# Patient Record
Sex: Male | Born: 1980
Health system: Southern US, Community
[De-identification: ages and names within clinical notes are randomized; demographics above are authoritative.]

## PROBLEM LIST (undated history)

## (undated) HISTORY — PX: SHOULDER SURGERY: SHX246

---

## 2011-01-25 ENCOUNTER — Emergency Department (HOSPITAL_BASED_OUTPATIENT_CLINIC_OR_DEPARTMENT_OTHER)
Admission: EM | Admit: 2011-01-25 | Discharge: 2011-01-25 | Disposition: A | Discharge status: Home / Self Care | Payer: BC Managed Care – PPO | Attending: Emergency Medicine | Admitting: Emergency Medicine

## 2011-01-25 DIAGNOSIS — H5789 Other specified disorders of eye and adnexa: Secondary | ICD-10-CM | POA: Insufficient documentation

## 2011-01-25 DIAGNOSIS — H109 Unspecified conjunctivitis: Secondary | ICD-10-CM | POA: Insufficient documentation

## 2011-05-06 ENCOUNTER — Emergency Department (HOSPITAL_BASED_OUTPATIENT_CLINIC_OR_DEPARTMENT_OTHER)
Admission: EM | Admit: 2011-05-06 | Discharge: 2011-05-06 | Disposition: A | Discharge status: Home / Self Care | Payer: BC Managed Care – PPO | Attending: Emergency Medicine | Admitting: Emergency Medicine

## 2011-05-06 ENCOUNTER — Emergency Department (INDEPENDENT_AMBULATORY_CARE_PROVIDER_SITE_OTHER): Payer: BC Managed Care – PPO

## 2011-05-06 ENCOUNTER — Encounter: Payer: Self-pay | Admitting: *Deleted

## 2011-05-06 DIAGNOSIS — W219XXA Striking against or struck by unspecified sports equipment, initial encounter: Secondary | ICD-10-CM

## 2011-05-06 DIAGNOSIS — S43006A Unspecified dislocation of unspecified shoulder joint, initial encounter: Secondary | ICD-10-CM | POA: Insufficient documentation

## 2011-05-06 DIAGNOSIS — Y9367 Activity, basketball: Secondary | ICD-10-CM

## 2011-05-06 DIAGNOSIS — S43016A Anterior dislocation of unspecified humerus, initial encounter: Secondary | ICD-10-CM

## 2011-05-06 MED ORDER — OXYCODONE-ACETAMINOPHEN 5-325 MG PO TABS
2.0000 | ORAL_TABLET | Freq: Once | ORAL | Status: AC
  Administered 2011-05-06: 2 via ORAL
  Filled 2011-05-06: qty 2

## 2011-05-06 MED ORDER — OXYCODONE-ACETAMINOPHEN 5-325 MG PO TABS: 2.0000 | ORAL_TABLET | ORAL | Status: AC | PRN

## 2011-05-06 MED ORDER — ETOMIDATE 2 MG/ML IV SOLN
15.0000 mg | Freq: Once | INTRAVENOUS | Status: AC
  Administered 2011-05-06: 15 mg via INTRAVENOUS
  Filled 2011-05-06: qty 10

## 2011-05-06 NOTE — ED Notes (Signed)
Pt arrived via [facility] for injury to left shoulder while playing basketball.  IV started enroute pt given Fentanyl .
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
## 2011-05-06 NOTE — ED Notes (Signed)
Pt was set up for Conscious Sedation procedure of left shoulder and [place] 2 lpm was placed for procedure. Pt tolerated well.
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
## 2011-05-06 NOTE — ED Notes (Signed)
Pt awake and talking. Awaiting repeat xray.

## 2011-05-28 ENCOUNTER — Other Ambulatory Visit: Payer: Self-pay | Admitting: Orthopedic Surgery

## 2011-05-28 DIAGNOSIS — M25512 Pain in left shoulder: Secondary | ICD-10-CM

## 2011-06-03 ENCOUNTER — Ambulatory Visit
Admission: RE | Admit: 2011-06-03 | Discharge: 2011-06-03 | Disposition: A | Discharge status: Home / Self Care | Payer: BC Managed Care – PPO | Source: Ambulatory Visit | Attending: Orthopedic Surgery | Admitting: Orthopedic Surgery

## 2011-06-03 DIAGNOSIS — M25512 Pain in left shoulder: Secondary | ICD-10-CM

## 2011-06-03 MED ORDER — IOHEXOL 180 MG/ML  SOLN
15.0000 mL | Freq: Once | INTRAMUSCULAR | Status: AC | PRN
  Administered 2011-06-03: 15 mL via INTRA_ARTICULAR

## 2011-06-25 ENCOUNTER — Ambulatory Visit: Payer: BC Managed Care – PPO | Admitting: Physical Therapy

## 2011-06-27 ENCOUNTER — Ambulatory Visit: Payer: BC Managed Care – PPO | Attending: Orthopedic Surgery | Admitting: Physical Therapy

## 2011-06-27 DIAGNOSIS — IMO0001 Reserved for inherently not codable concepts without codable children: Secondary | ICD-10-CM | POA: Insufficient documentation

## 2011-06-27 DIAGNOSIS — M25519 Pain in unspecified shoulder: Secondary | ICD-10-CM | POA: Insufficient documentation

## 2011-06-27 DIAGNOSIS — M25619 Stiffness of unspecified shoulder, not elsewhere classified: Secondary | ICD-10-CM | POA: Insufficient documentation

## 2011-07-01 ENCOUNTER — Ambulatory Visit: Payer: BC Managed Care – PPO | Admitting: Physical Therapy

## 2011-07-02 ENCOUNTER — Encounter: Payer: BC Managed Care – PPO | Admitting: Physical Therapy

## 2011-07-03 ENCOUNTER — Ambulatory Visit: Payer: BC Managed Care – PPO | Admitting: Physical Therapy

## 2011-07-08 ENCOUNTER — Ambulatory Visit: Payer: BC Managed Care – PPO | Admitting: Physical Therapy

## 2011-07-10 ENCOUNTER — Ambulatory Visit: Payer: BC Managed Care – PPO | Admitting: Physical Therapy

## 2011-07-12 ENCOUNTER — Ambulatory Visit: Payer: BC Managed Care – PPO | Admitting: Physical Therapy

## 2011-07-16 ENCOUNTER — Ambulatory Visit: Payer: BC Managed Care – PPO | Attending: Orthopedic Surgery | Admitting: Physical Therapy

## 2011-07-16 DIAGNOSIS — M25519 Pain in unspecified shoulder: Secondary | ICD-10-CM | POA: Insufficient documentation

## 2011-07-16 DIAGNOSIS — M25619 Stiffness of unspecified shoulder, not elsewhere classified: Secondary | ICD-10-CM | POA: Insufficient documentation

## 2011-07-16 DIAGNOSIS — IMO0001 Reserved for inherently not codable concepts without codable children: Secondary | ICD-10-CM | POA: Insufficient documentation

## 2011-07-17 ENCOUNTER — Ambulatory Visit: Payer: BC Managed Care – PPO | Admitting: Physical Therapy

## 2011-07-19 ENCOUNTER — Ambulatory Visit: Payer: BC Managed Care – PPO | Admitting: Physical Therapy

## 2011-07-22 ENCOUNTER — Ambulatory Visit: Payer: BC Managed Care – PPO | Admitting: Physical Therapy

## 2011-07-24 ENCOUNTER — Ambulatory Visit: Payer: BC Managed Care – PPO | Admitting: Physical Therapy

## 2011-07-26 ENCOUNTER — Ambulatory Visit: Payer: BC Managed Care – PPO | Admitting: Physical Therapy

## 2011-07-29 ENCOUNTER — Ambulatory Visit: Payer: BC Managed Care – PPO | Admitting: Physical Therapy

## 2011-07-31 ENCOUNTER — Ambulatory Visit: Payer: BC Managed Care – PPO | Admitting: Physical Therapy

## 2011-08-01 ENCOUNTER — Ambulatory Visit: Payer: BC Managed Care – PPO | Admitting: Physical Therapy

## 2011-08-02 ENCOUNTER — Encounter: Payer: BC Managed Care – PPO | Admitting: Physical Therapy

## 2011-08-07 ENCOUNTER — Ambulatory Visit: Payer: BC Managed Care – PPO | Admitting: Physical Therapy

## 2011-08-08 ENCOUNTER — Ambulatory Visit: Payer: BC Managed Care – PPO | Admitting: Physical Therapy

## 2011-08-09 ENCOUNTER — Ambulatory Visit: Payer: BC Managed Care – PPO | Admitting: Physical Therapy

## 2011-08-12 ENCOUNTER — Ambulatory Visit: Payer: BC Managed Care – PPO | Admitting: Physical Therapy

## 2012-10-14 IMAGING — CR DG SHOULDER 1V*L*
2 series · 2 of 2 positions shown · non-contrast
Comparison: Prereduction radiographs performed earlier today at
[DATE] p.m.

CLINICAL DATA: Status post reduction of left shoulder dislocation.

PORTABLE LEFT SHOULDER - 2+ VIEW

[view not recorded (1 of 2)]
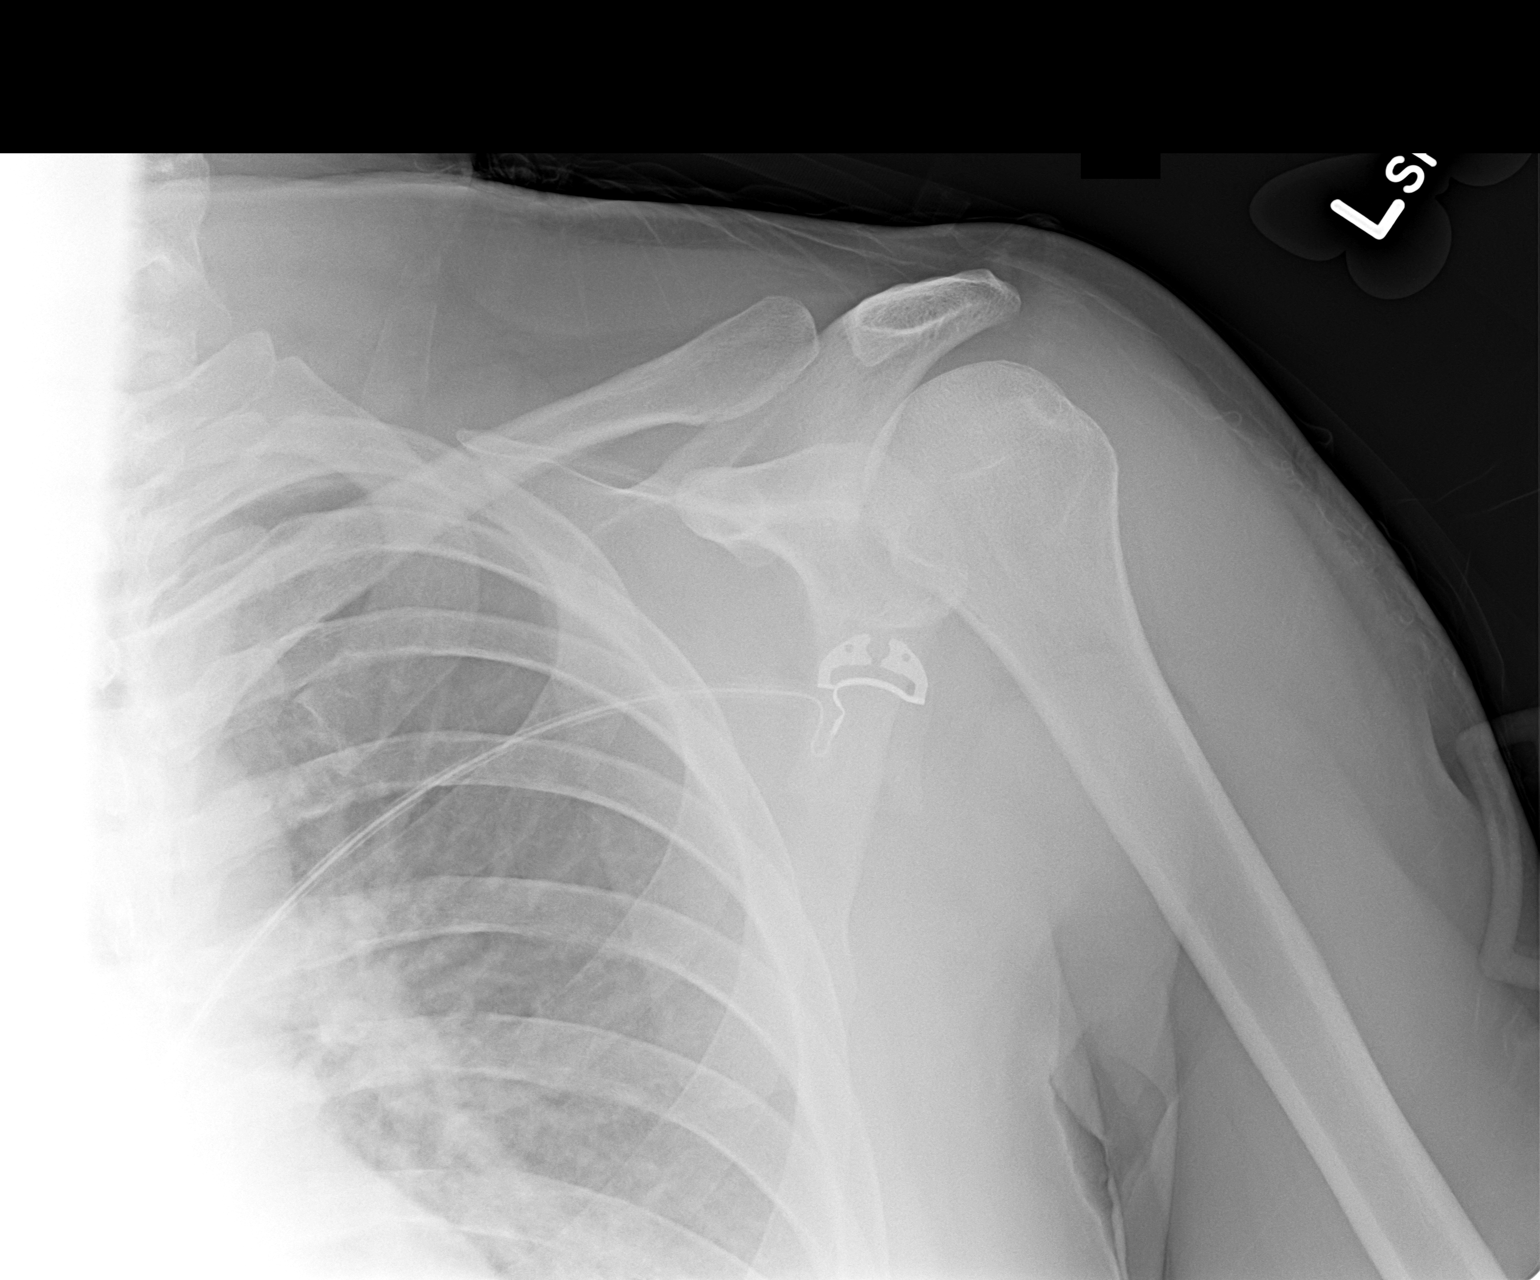

[view not recorded (2 of 2)]
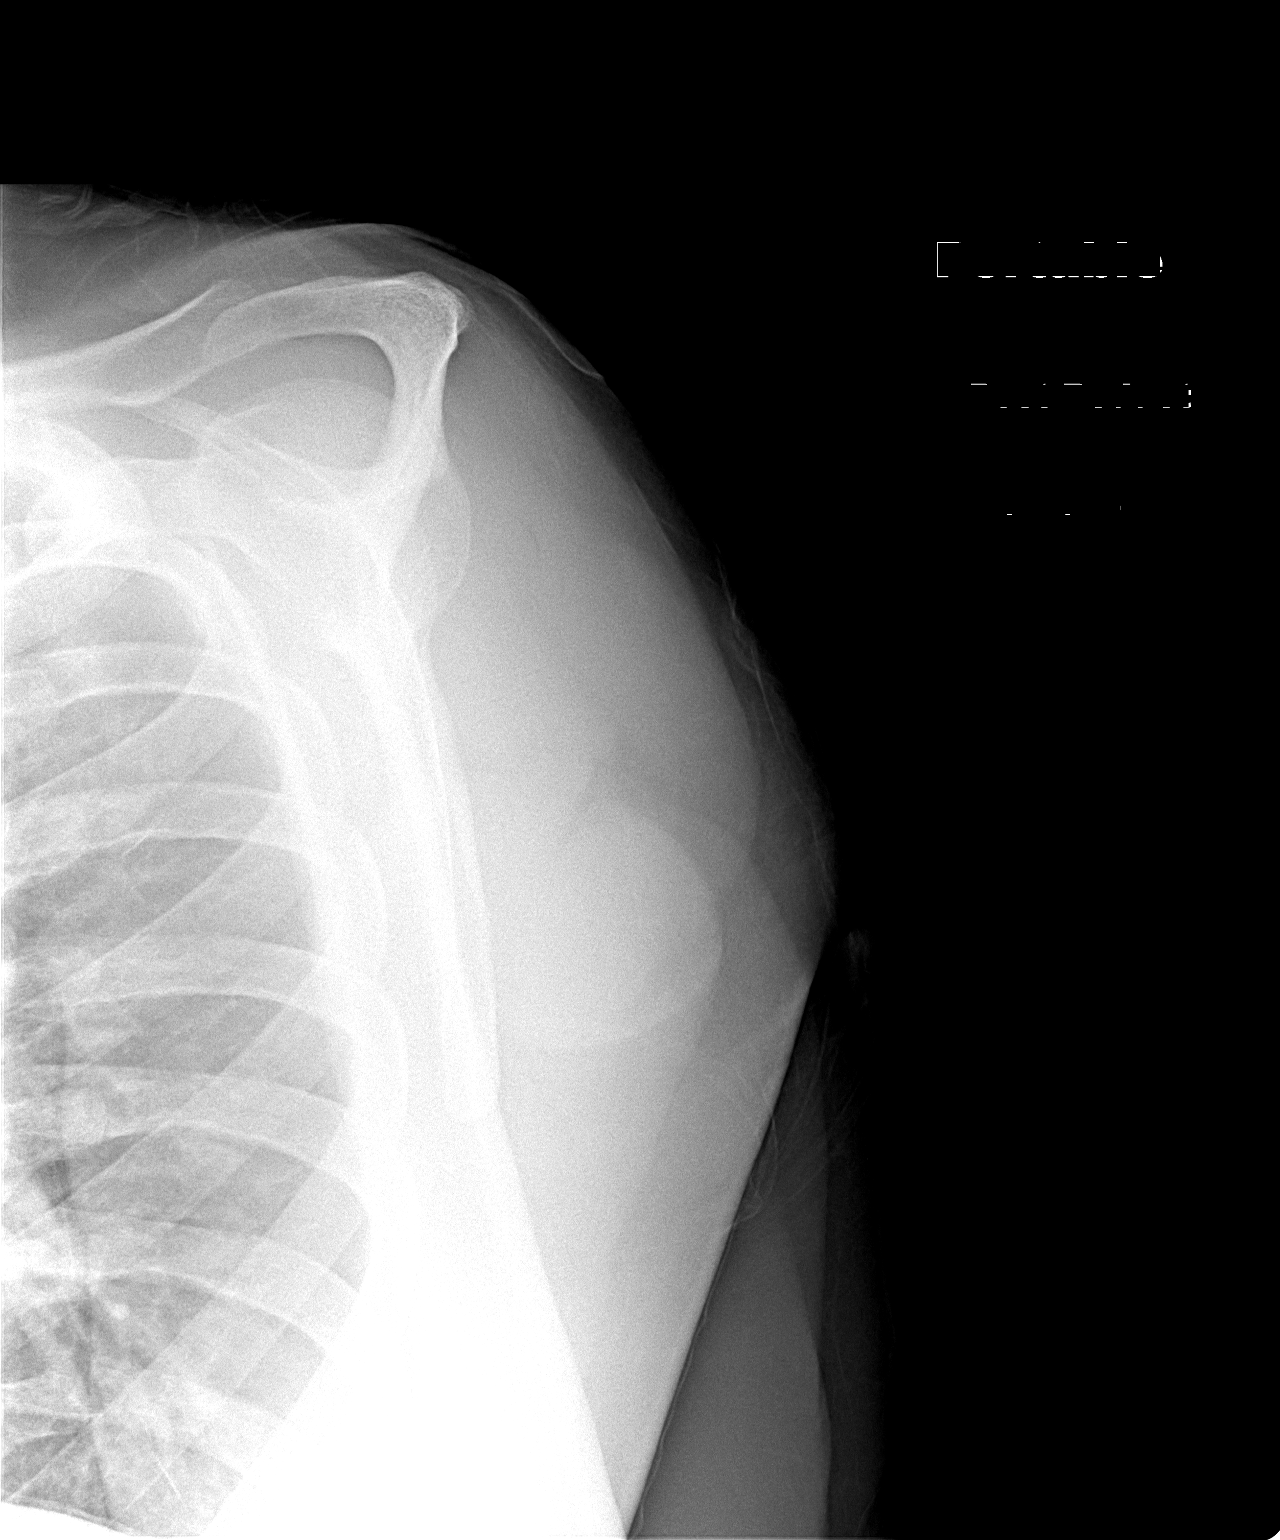

[2 of 2 positions shown; findings below may reference images not displayed]

FINDINGS: There has been successful interval reduction of the
previously noted dislocation of the left humeral head.  The left
humeral head is seated normally at the glenoid fossa.  No definite
Hill-Sachs lesion is characterized on this study.  No osseous
Bankart lesion is seen.

No significant soft tissue abnormalities are characterized.  The
visualized portions of the left lung appear clear.  The left
acromioclavicular joint is unremarkable in appearance.
IMPRESSION: Successful reduction of left humeral head dislocation.  No definite
Hill-Sachs or osseous Bankart lesion seen.

## 2012-11-11 IMAGING — RF DG FLUORO GUIDE NDL PLC/BX
2 series · 2 of 2 positions shown · IV contrast (multihance)
Comparison: none

CLINICAL DATA: Left shoulder pain.  Recent dislocation.  Previous
shoulder surgery.

Fluoroscopy Time: 38 seconds
LEFT SHOULDER INJECTION UNDER FLUOROSCOPY
TECHNIQUE: An appropriate skin entrance site was determined. The
site was marked, prepped with Betadine, draped in the usual sterile
fashion, and infiltrated locally with buffered Lidocaine.  22 gauge
spinal needle was advanced to the superomedial margin of the
humeral head under intermittent fluoroscopy.  1 ml of Lidocaine
injected easily.  A mixture of 0.1 ml Multihance and 20 ml of
dilute Omnipaque 180 was then used to opacify the left shoulder
capsule.  No immediate complication.

[Series 1: (hospital) · 1 of 1 slices shown (1 of 2)]
[im 1/1]
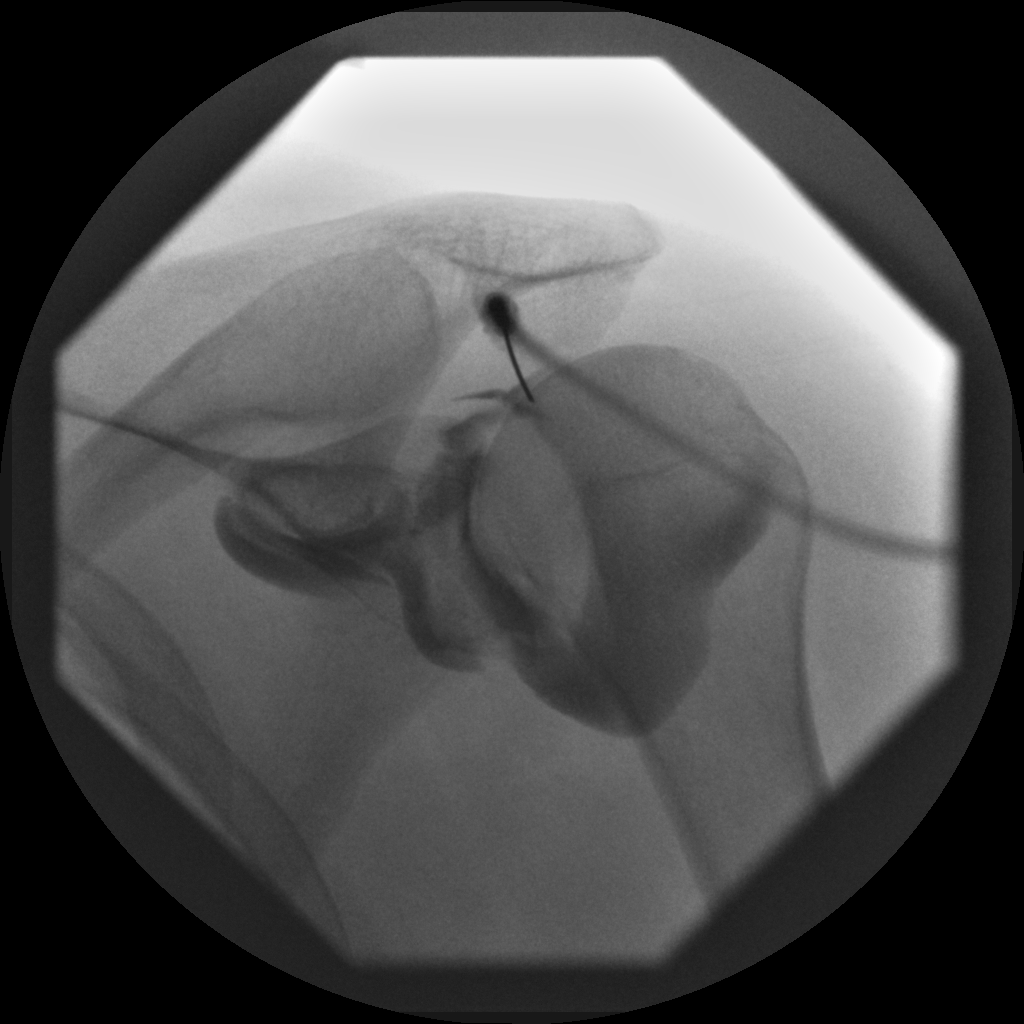

[Series 2: (hospital) · 1 of 1 slices shown (2 of 2)]
[im 1/1]
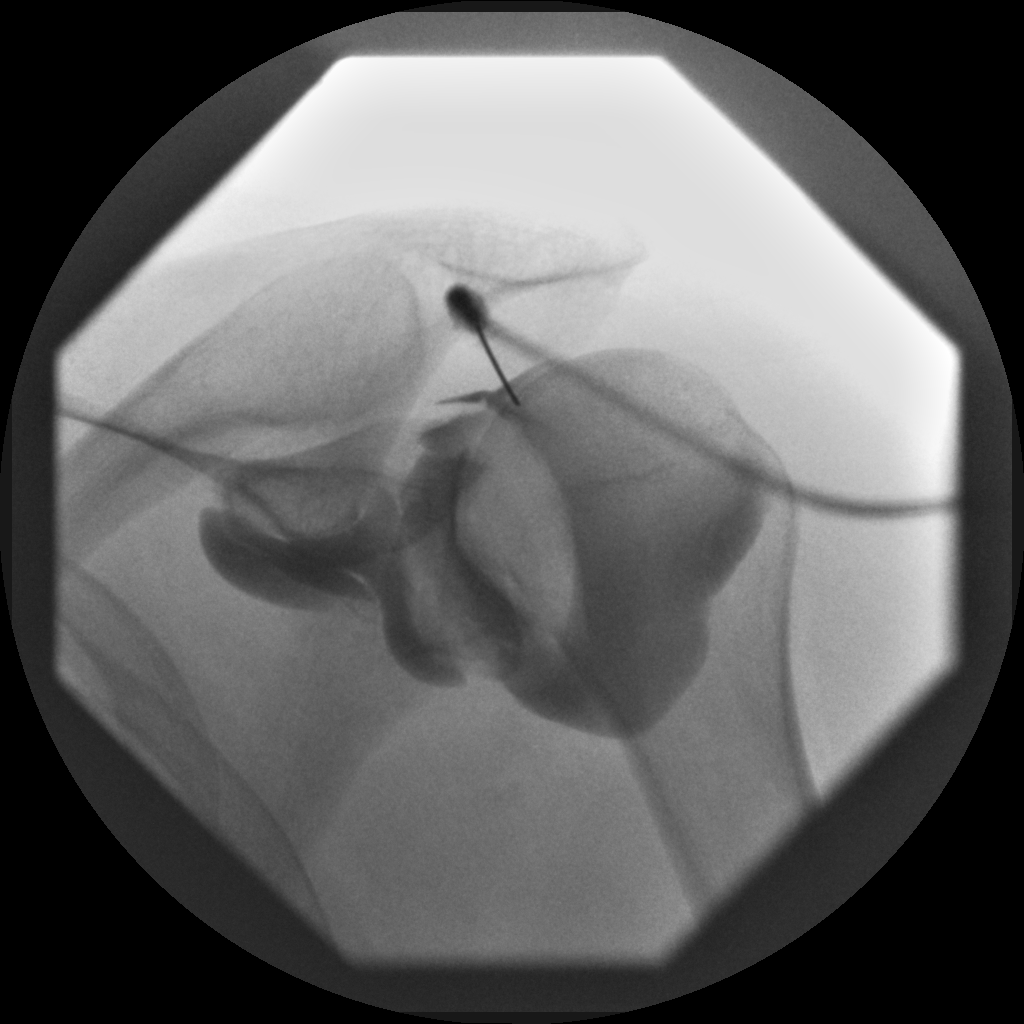

[2 of 2 positions shown; findings below may reference images not displayed]

IMPRESSION: Technically successful left shoulder injection for MRI.

## 2013-08-02 ENCOUNTER — Ambulatory Visit (INDEPENDENT_AMBULATORY_CARE_PROVIDER_SITE_OTHER): Payer: BC Managed Care – PPO | Admitting: Neurology

## 2013-08-02 ENCOUNTER — Encounter: Payer: Self-pay | Admitting: Neurology

## 2013-08-02 VITALS — BP 130/80 | HR 80 | Temp 97.9°F | Resp 14 | Ht 68.0 in | Wt 203.8 lb

## 2013-08-02 DIAGNOSIS — F0781 Postconcussional syndrome: Secondary | ICD-10-CM | POA: Insufficient documentation

## 2013-08-02 MED ORDER — AMITRIPTYLINE HCL 25 MG PO TABS: 25.0000 mg | ORAL_TABLET | Freq: Every day | ORAL | Status: DC
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
## 2013-08-02 NOTE — Patient Instructions (Signed)
You have post-concussion syndrome:  1.  Start amitriptyline 25mg  daily for headache prevention 2.  You may resume your usual activity with frequent breaks, especially if you notice cognitive fatigue.   3.  Literature has been provided on post-concussion syndrome 4.  Return to clinic in 6-weeks

## 2013-09-14 ENCOUNTER — Encounter: Payer: Self-pay | Admitting: Neurology

## 2013-09-14 ENCOUNTER — Ambulatory Visit (INDEPENDENT_AMBULATORY_CARE_PROVIDER_SITE_OTHER): Payer: BC Managed Care – PPO | Admitting: Neurology

## 2013-09-14 VITALS — BP 120/84 | HR 80 | Temp 98.1°F | Wt 203.5 lb

## 2013-09-14 DIAGNOSIS — F0781 Postconcussional syndrome: Secondary | ICD-10-CM

## 2013-09-14 MED ORDER — AMITRIPTYLINE HCL 50 MG PO TABS: 50.0000 mg | ORAL_TABLET | Freq: Every day | ORAL | Status: DC
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
## 2013-09-14 NOTE — Patient Instructions (Addendum)
1.  Increase amitriptyline to 50mg  daily.   2.  Discussed to limit use of ibuprofen, tylenol, or naproxsyn no more than twice per week 3.  Continue physical therapy  4.  Return to clinic in 336-months

## 2014-03-15 ENCOUNTER — Ambulatory Visit (INDEPENDENT_AMBULATORY_CARE_PROVIDER_SITE_OTHER): Payer: BC Managed Care – PPO | Admitting: Neurology

## 2014-03-15 ENCOUNTER — Encounter: Payer: Self-pay | Admitting: Neurology

## 2014-03-15 VITALS — BP 118/80 | HR 81 | Ht 69.29 in | Wt 204.5 lb

## 2014-03-15 DIAGNOSIS — G3184 Mild cognitive impairment, so stated: Secondary | ICD-10-CM

## 2014-03-15 DIAGNOSIS — F0781 Postconcussional syndrome: Secondary | ICD-10-CM
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
## 2014-03-15 NOTE — Patient Instructions (Addendum)
1.  Neuropsychiatry testing 2.  Continue amitriptyline 50mg  at bedtime 3.  Return to clinic in 636-months

## 2014-05-12 ENCOUNTER — Telehealth: Payer: Self-pay | Admitting: Neurology

## 2014-05-12 NOTE — Telephone Encounter (Signed)
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

## 2014-09-20 ENCOUNTER — Ambulatory Visit (INDEPENDENT_AMBULATORY_CARE_PROVIDER_SITE_OTHER): Payer: BLUE CROSS/BLUE SHIELD | Admitting: Neurology

## 2014-09-20 ENCOUNTER — Encounter: Payer: Self-pay | Admitting: Neurology

## 2014-09-20 VITALS — BP 124/86 | HR 74 | Ht 69.0 in | Wt 202.1 lb

## 2014-09-20 DIAGNOSIS — F0781 Postconcussional syndrome: Secondary | ICD-10-CM

## 2014-09-20 MED ORDER — AMITRIPTYLINE HCL 50 MG PO TABS: 50.0000 mg | ORAL_TABLET | Freq: Every day | ORAL | Status: DC

## 2014-09-20 NOTE — Patient Instructions (Addendum)
1.  Continue amitriptyline 50mg  at bedtime 2.  Recommend cognitive behavior therapy 3.  Return to clinic 6-12 months
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
## 2015-04-18 ENCOUNTER — Ambulatory Visit: Payer: BLUE CROSS/BLUE SHIELD | Admitting: Neurology

## 2015-05-17 ENCOUNTER — Ambulatory Visit (INDEPENDENT_AMBULATORY_CARE_PROVIDER_SITE_OTHER): Payer: BLUE CROSS/BLUE SHIELD | Admitting: Neurology

## 2015-05-17 ENCOUNTER — Encounter: Payer: Self-pay | Admitting: Neurology

## 2015-05-17 VITALS — BP 130/84 | HR 81 | Ht 69.0 in | Wt 208.2 lb

## 2015-05-17 DIAGNOSIS — F0781 Postconcussional syndrome: Secondary | ICD-10-CM | POA: Diagnosis not present

## 2015-05-17 MED ORDER — AMITRIPTYLINE HCL 10 MG PO TABS: 10.0000 mg | ORAL_TABLET | Freq: Every day | ORAL | Status: DC

## 2015-05-17 NOTE — Patient Instructions (Signed)
Keep up the great work! Continue amitriptyline  at bedtime You may consider [email or web address] for brain activities Return to clinic as needed
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
## 2015-07-20 ENCOUNTER — Other Ambulatory Visit: Payer: Self-pay | Admitting: *Deleted

## 2015-07-20 ENCOUNTER — Telehealth: Payer: Self-pay | Admitting: Neurology

## 2015-07-20 MED ORDER — AMITRIPTYLINE HCL 10 MG PO TABS: 10.0000 mg | ORAL_TABLET | Freq: Every day | ORAL | Status: AC

## 2015-07-20 NOTE — Telephone Encounter (Signed)
Rx sent 

## 2015-07-20 NOTE — Telephone Encounter (Signed)
PT called and needs a refill of his medication Amitriptyline/Dawn CB# [phone]

## 2015-08-16 ENCOUNTER — Telehealth: Payer: Self-pay | Admitting: Neurology

## 2015-08-16 NOTE — Telephone Encounter (Signed)
Neuropsychological testing 07/10/2015 from [place]: Major depressive disorder, post-traumatic stress disorder.  Continue treatment for anxiety and depression.  From a year ago, patient demonstrated improvement in some aspects of cognitive functioning including processing speed, sustianed attention, and response inhibition.  He also demonstrated some declines in functioning; however, performance on a robust measure of effort/motiviation was suboptimal.

## 2016-07-25 ENCOUNTER — Ambulatory Visit (INDEPENDENT_AMBULATORY_CARE_PROVIDER_SITE_OTHER): Payer: 59 | Admitting: Otolaryngology

## 2016-07-25 DIAGNOSIS — L03211 Cellulitis of face: Secondary | ICD-10-CM | POA: Diagnosis not present

## 2016-10-30 DIAGNOSIS — M62830 Muscle spasm of back: Secondary | ICD-10-CM | POA: Diagnosis not present

## 2016-11-04 DIAGNOSIS — R0981 Nasal congestion: Secondary | ICD-10-CM | POA: Diagnosis not present

## 2017-05-30 DIAGNOSIS — Z23 Encounter for immunization: Secondary | ICD-10-CM | POA: Diagnosis not present

## 2017-06-30 DIAGNOSIS — J209 Acute bronchitis, unspecified: Secondary | ICD-10-CM | POA: Diagnosis not present

## 2017-06-30 DIAGNOSIS — R05 Cough: Secondary | ICD-10-CM | POA: Diagnosis not present

## 2017-06-30 DIAGNOSIS — J309 Allergic rhinitis, unspecified: Secondary | ICD-10-CM | POA: Diagnosis not present

## 2017-07-11 DIAGNOSIS — J301 Allergic rhinitis due to pollen: Secondary | ICD-10-CM | POA: Diagnosis not present

## 2017-07-11 DIAGNOSIS — J4520 Mild intermittent asthma, uncomplicated: Secondary | ICD-10-CM | POA: Diagnosis not present

## 2017-07-11 DIAGNOSIS — J3089 Other allergic rhinitis: Secondary | ICD-10-CM | POA: Diagnosis not present

## 2017-07-11 DIAGNOSIS — H1045 Other chronic allergic conjunctivitis: Secondary | ICD-10-CM | POA: Diagnosis not present

## 2017-07-24 DIAGNOSIS — J301 Allergic rhinitis due to pollen: Secondary | ICD-10-CM | POA: Diagnosis not present

## 2017-07-25 DIAGNOSIS — J3089 Other allergic rhinitis: Secondary | ICD-10-CM | POA: Diagnosis not present

## 2017-10-07 DIAGNOSIS — J301 Allergic rhinitis due to pollen: Secondary | ICD-10-CM | POA: Diagnosis not present

## 2017-10-07 DIAGNOSIS — J3089 Other allergic rhinitis: Secondary | ICD-10-CM | POA: Diagnosis not present

## 2017-10-09 DIAGNOSIS — J3089 Other allergic rhinitis: Secondary | ICD-10-CM | POA: Diagnosis not present

## 2017-10-09 DIAGNOSIS — J301 Allergic rhinitis due to pollen: Secondary | ICD-10-CM | POA: Diagnosis not present

## 2017-10-13 DIAGNOSIS — J301 Allergic rhinitis due to pollen: Secondary | ICD-10-CM | POA: Diagnosis not present

## 2017-10-13 DIAGNOSIS — J3089 Other allergic rhinitis: Secondary | ICD-10-CM | POA: Diagnosis not present

## 2017-10-15 DIAGNOSIS — J3089 Other allergic rhinitis: Secondary | ICD-10-CM | POA: Diagnosis not present

## 2017-10-15 DIAGNOSIS — J301 Allergic rhinitis due to pollen: Secondary | ICD-10-CM | POA: Diagnosis not present

## 2017-10-21 DIAGNOSIS — J301 Allergic rhinitis due to pollen: Secondary | ICD-10-CM | POA: Diagnosis not present

## 2017-10-21 DIAGNOSIS — J3089 Other allergic rhinitis: Secondary | ICD-10-CM | POA: Diagnosis not present

## 2017-10-23 DIAGNOSIS — J3089 Other allergic rhinitis: Secondary | ICD-10-CM | POA: Diagnosis not present

## 2017-10-23 DIAGNOSIS — J301 Allergic rhinitis due to pollen: Secondary | ICD-10-CM | POA: Diagnosis not present

## 2017-10-27 DIAGNOSIS — J3089 Other allergic rhinitis: Secondary | ICD-10-CM | POA: Diagnosis not present

## 2017-10-27 DIAGNOSIS — J301 Allergic rhinitis due to pollen: Secondary | ICD-10-CM | POA: Diagnosis not present

## 2017-10-31 DIAGNOSIS — J301 Allergic rhinitis due to pollen: Secondary | ICD-10-CM | POA: Diagnosis not present

## 2017-10-31 DIAGNOSIS — J3089 Other allergic rhinitis: Secondary | ICD-10-CM | POA: Diagnosis not present

## 2017-11-03 DIAGNOSIS — J3089 Other allergic rhinitis: Secondary | ICD-10-CM | POA: Diagnosis not present

## 2017-11-03 DIAGNOSIS — J301 Allergic rhinitis due to pollen: Secondary | ICD-10-CM | POA: Diagnosis not present

## 2017-11-06 DIAGNOSIS — J3089 Other allergic rhinitis: Secondary | ICD-10-CM | POA: Diagnosis not present

## 2017-11-06 DIAGNOSIS — J301 Allergic rhinitis due to pollen: Secondary | ICD-10-CM | POA: Diagnosis not present

## 2017-11-10 DIAGNOSIS — J3089 Other allergic rhinitis: Secondary | ICD-10-CM | POA: Diagnosis not present

## 2017-11-10 DIAGNOSIS — J301 Allergic rhinitis due to pollen: Secondary | ICD-10-CM | POA: Diagnosis not present

## 2017-11-12 DIAGNOSIS — J301 Allergic rhinitis due to pollen: Secondary | ICD-10-CM | POA: Diagnosis not present

## 2017-11-12 DIAGNOSIS — J3089 Other allergic rhinitis: Secondary | ICD-10-CM | POA: Diagnosis not present

## 2017-11-17 DIAGNOSIS — J301 Allergic rhinitis due to pollen: Secondary | ICD-10-CM | POA: Diagnosis not present

## 2017-11-17 DIAGNOSIS — J3089 Other allergic rhinitis: Secondary | ICD-10-CM | POA: Diagnosis not present

## 2017-11-19 DIAGNOSIS — J3089 Other allergic rhinitis: Secondary | ICD-10-CM | POA: Diagnosis not present

## 2017-11-19 DIAGNOSIS — J301 Allergic rhinitis due to pollen: Secondary | ICD-10-CM | POA: Diagnosis not present

## 2017-11-21 DIAGNOSIS — J3089 Other allergic rhinitis: Secondary | ICD-10-CM | POA: Diagnosis not present

## 2017-11-21 DIAGNOSIS — J301 Allergic rhinitis due to pollen: Secondary | ICD-10-CM | POA: Diagnosis not present

## 2017-11-26 DIAGNOSIS — J301 Allergic rhinitis due to pollen: Secondary | ICD-10-CM | POA: Diagnosis not present

## 2017-11-26 DIAGNOSIS — J3089 Other allergic rhinitis: Secondary | ICD-10-CM | POA: Diagnosis not present

## 2017-12-03 DIAGNOSIS — J301 Allergic rhinitis due to pollen: Secondary | ICD-10-CM | POA: Diagnosis not present

## 2017-12-03 DIAGNOSIS — J3089 Other allergic rhinitis: Secondary | ICD-10-CM | POA: Diagnosis not present

## 2017-12-08 DIAGNOSIS — J3089 Other allergic rhinitis: Secondary | ICD-10-CM | POA: Diagnosis not present

## 2017-12-08 DIAGNOSIS — J301 Allergic rhinitis due to pollen: Secondary | ICD-10-CM | POA: Diagnosis not present

## 2017-12-16 DIAGNOSIS — J3089 Other allergic rhinitis: Secondary | ICD-10-CM | POA: Diagnosis not present

## 2017-12-16 DIAGNOSIS — J301 Allergic rhinitis due to pollen: Secondary | ICD-10-CM | POA: Diagnosis not present

## 2017-12-18 DIAGNOSIS — J301 Allergic rhinitis due to pollen: Secondary | ICD-10-CM | POA: Diagnosis not present

## 2017-12-18 DIAGNOSIS — J3089 Other allergic rhinitis: Secondary | ICD-10-CM | POA: Diagnosis not present

## 2017-12-18 DIAGNOSIS — M79642 Pain in left hand: Secondary | ICD-10-CM | POA: Diagnosis not present

## 2017-12-18 DIAGNOSIS — M79641 Pain in right hand: Secondary | ICD-10-CM | POA: Diagnosis not present

## 2017-12-18 DIAGNOSIS — M25531 Pain in right wrist: Secondary | ICD-10-CM | POA: Diagnosis not present

## 2017-12-22 DIAGNOSIS — J301 Allergic rhinitis due to pollen: Secondary | ICD-10-CM | POA: Diagnosis not present

## 2017-12-22 DIAGNOSIS — J3089 Other allergic rhinitis: Secondary | ICD-10-CM | POA: Diagnosis not present

## 2017-12-25 DIAGNOSIS — J3089 Other allergic rhinitis: Secondary | ICD-10-CM | POA: Diagnosis not present

## 2017-12-25 DIAGNOSIS — J301 Allergic rhinitis due to pollen: Secondary | ICD-10-CM | POA: Diagnosis not present

## 2017-12-30 DIAGNOSIS — J3089 Other allergic rhinitis: Secondary | ICD-10-CM | POA: Diagnosis not present

## 2017-12-30 DIAGNOSIS — J301 Allergic rhinitis due to pollen: Secondary | ICD-10-CM | POA: Diagnosis not present

## 2017-12-31 DIAGNOSIS — J301 Allergic rhinitis due to pollen: Secondary | ICD-10-CM | POA: Diagnosis not present

## 2018-01-01 DIAGNOSIS — J3089 Other allergic rhinitis: Secondary | ICD-10-CM | POA: Diagnosis not present

## 2018-01-06 DIAGNOSIS — J301 Allergic rhinitis due to pollen: Secondary | ICD-10-CM | POA: Diagnosis not present

## 2018-01-06 DIAGNOSIS — J3089 Other allergic rhinitis: Secondary | ICD-10-CM | POA: Diagnosis not present

## 2018-01-20 DIAGNOSIS — J301 Allergic rhinitis due to pollen: Secondary | ICD-10-CM | POA: Diagnosis not present

## 2018-01-20 DIAGNOSIS — J3089 Other allergic rhinitis: Secondary | ICD-10-CM | POA: Diagnosis not present

## 2018-01-27 DIAGNOSIS — J301 Allergic rhinitis due to pollen: Secondary | ICD-10-CM | POA: Diagnosis not present

## 2018-01-27 DIAGNOSIS — J3089 Other allergic rhinitis: Secondary | ICD-10-CM | POA: Diagnosis not present

## 2018-02-04 DIAGNOSIS — J301 Allergic rhinitis due to pollen: Secondary | ICD-10-CM | POA: Diagnosis not present

## 2018-02-04 DIAGNOSIS — J3089 Other allergic rhinitis: Secondary | ICD-10-CM | POA: Diagnosis not present

## 2018-02-06 DIAGNOSIS — J3089 Other allergic rhinitis: Secondary | ICD-10-CM | POA: Diagnosis not present

## 2018-02-06 DIAGNOSIS — J4520 Mild intermittent asthma, uncomplicated: Secondary | ICD-10-CM | POA: Diagnosis not present

## 2018-02-06 DIAGNOSIS — H1045 Other chronic allergic conjunctivitis: Secondary | ICD-10-CM | POA: Diagnosis not present

## 2018-02-06 DIAGNOSIS — J301 Allergic rhinitis due to pollen: Secondary | ICD-10-CM | POA: Diagnosis not present

## 2018-02-20 DIAGNOSIS — J3089 Other allergic rhinitis: Secondary | ICD-10-CM | POA: Diagnosis not present

## 2018-02-20 DIAGNOSIS — J301 Allergic rhinitis due to pollen: Secondary | ICD-10-CM | POA: Diagnosis not present

## 2018-02-25 DIAGNOSIS — J301 Allergic rhinitis due to pollen: Secondary | ICD-10-CM | POA: Diagnosis not present

## 2018-02-25 DIAGNOSIS — J3089 Other allergic rhinitis: Secondary | ICD-10-CM | POA: Diagnosis not present

## 2018-03-02 DIAGNOSIS — J301 Allergic rhinitis due to pollen: Secondary | ICD-10-CM | POA: Diagnosis not present

## 2018-03-02 DIAGNOSIS — J3089 Other allergic rhinitis: Secondary | ICD-10-CM | POA: Diagnosis not present

## 2018-03-12 DIAGNOSIS — J3089 Other allergic rhinitis: Secondary | ICD-10-CM | POA: Diagnosis not present

## 2018-03-12 DIAGNOSIS — J301 Allergic rhinitis due to pollen: Secondary | ICD-10-CM | POA: Diagnosis not present

## 2018-03-17 DIAGNOSIS — J301 Allergic rhinitis due to pollen: Secondary | ICD-10-CM | POA: Diagnosis not present

## 2018-03-17 DIAGNOSIS — J3089 Other allergic rhinitis: Secondary | ICD-10-CM | POA: Diagnosis not present

## 2018-03-20 DIAGNOSIS — J301 Allergic rhinitis due to pollen: Secondary | ICD-10-CM | POA: Diagnosis not present

## 2018-03-20 DIAGNOSIS — J3089 Other allergic rhinitis: Secondary | ICD-10-CM | POA: Diagnosis not present

## 2018-03-24 DIAGNOSIS — B999 Unspecified infectious disease: Secondary | ICD-10-CM | POA: Diagnosis not present

## 2018-03-24 DIAGNOSIS — J3089 Other allergic rhinitis: Secondary | ICD-10-CM | POA: Diagnosis not present

## 2018-03-24 DIAGNOSIS — T63441A Toxic effect of venom of bees, accidental (unintentional), initial encounter: Secondary | ICD-10-CM | POA: Diagnosis not present

## 2018-03-24 DIAGNOSIS — J301 Allergic rhinitis due to pollen: Secondary | ICD-10-CM | POA: Diagnosis not present

## 2018-03-30 DIAGNOSIS — J301 Allergic rhinitis due to pollen: Secondary | ICD-10-CM | POA: Diagnosis not present

## 2018-03-30 DIAGNOSIS — J3089 Other allergic rhinitis: Secondary | ICD-10-CM | POA: Diagnosis not present

## 2018-04-01 DIAGNOSIS — J3089 Other allergic rhinitis: Secondary | ICD-10-CM | POA: Diagnosis not present

## 2018-04-01 DIAGNOSIS — J301 Allergic rhinitis due to pollen: Secondary | ICD-10-CM | POA: Diagnosis not present

## 2018-04-07 DIAGNOSIS — J301 Allergic rhinitis due to pollen: Secondary | ICD-10-CM | POA: Diagnosis not present

## 2018-04-07 DIAGNOSIS — J3089 Other allergic rhinitis: Secondary | ICD-10-CM | POA: Diagnosis not present

## 2018-04-15 DIAGNOSIS — J3089 Other allergic rhinitis: Secondary | ICD-10-CM | POA: Diagnosis not present

## 2018-04-15 DIAGNOSIS — J301 Allergic rhinitis due to pollen: Secondary | ICD-10-CM | POA: Diagnosis not present

## 2018-04-23 DIAGNOSIS — J301 Allergic rhinitis due to pollen: Secondary | ICD-10-CM | POA: Diagnosis not present

## 2018-04-23 DIAGNOSIS — J3089 Other allergic rhinitis: Secondary | ICD-10-CM | POA: Diagnosis not present

## 2018-04-30 DIAGNOSIS — J301 Allergic rhinitis due to pollen: Secondary | ICD-10-CM | POA: Diagnosis not present

## 2018-04-30 DIAGNOSIS — J3089 Other allergic rhinitis: Secondary | ICD-10-CM | POA: Diagnosis not present

## 2018-05-05 DIAGNOSIS — J3089 Other allergic rhinitis: Secondary | ICD-10-CM | POA: Diagnosis not present

## 2018-05-05 DIAGNOSIS — J301 Allergic rhinitis due to pollen: Secondary | ICD-10-CM | POA: Diagnosis not present

## 2018-05-13 DIAGNOSIS — J301 Allergic rhinitis due to pollen: Secondary | ICD-10-CM | POA: Diagnosis not present

## 2018-05-13 DIAGNOSIS — J3089 Other allergic rhinitis: Secondary | ICD-10-CM | POA: Diagnosis not present

## 2018-05-22 DIAGNOSIS — J301 Allergic rhinitis due to pollen: Secondary | ICD-10-CM | POA: Diagnosis not present

## 2018-05-22 DIAGNOSIS — J3089 Other allergic rhinitis: Secondary | ICD-10-CM | POA: Diagnosis not present

## 2018-05-27 DIAGNOSIS — J301 Allergic rhinitis due to pollen: Secondary | ICD-10-CM | POA: Diagnosis not present

## 2018-05-27 DIAGNOSIS — J3089 Other allergic rhinitis: Secondary | ICD-10-CM | POA: Diagnosis not present

## 2018-06-03 DIAGNOSIS — Z23 Encounter for immunization: Secondary | ICD-10-CM | POA: Diagnosis not present

## 2018-06-03 DIAGNOSIS — J3089 Other allergic rhinitis: Secondary | ICD-10-CM | POA: Diagnosis not present

## 2018-06-03 DIAGNOSIS — J301 Allergic rhinitis due to pollen: Secondary | ICD-10-CM | POA: Diagnosis not present

## 2018-06-16 DIAGNOSIS — J3089 Other allergic rhinitis: Secondary | ICD-10-CM | POA: Diagnosis not present

## 2018-06-16 DIAGNOSIS — J301 Allergic rhinitis due to pollen: Secondary | ICD-10-CM | POA: Diagnosis not present

## 2018-06-23 DIAGNOSIS — J3089 Other allergic rhinitis: Secondary | ICD-10-CM | POA: Diagnosis not present

## 2018-06-23 DIAGNOSIS — J301 Allergic rhinitis due to pollen: Secondary | ICD-10-CM | POA: Diagnosis not present

## 2018-07-01 DIAGNOSIS — J301 Allergic rhinitis due to pollen: Secondary | ICD-10-CM | POA: Diagnosis not present

## 2018-07-01 DIAGNOSIS — J3089 Other allergic rhinitis: Secondary | ICD-10-CM | POA: Diagnosis not present

## 2018-07-02 DIAGNOSIS — J301 Allergic rhinitis due to pollen: Secondary | ICD-10-CM | POA: Diagnosis not present

## 2018-07-02 DIAGNOSIS — J3089 Other allergic rhinitis: Secondary | ICD-10-CM | POA: Diagnosis not present

## 2018-07-16 DIAGNOSIS — J301 Allergic rhinitis due to pollen: Secondary | ICD-10-CM | POA: Diagnosis not present

## 2018-07-16 DIAGNOSIS — J3089 Other allergic rhinitis: Secondary | ICD-10-CM | POA: Diagnosis not present

## 2018-08-10 DIAGNOSIS — J301 Allergic rhinitis due to pollen: Secondary | ICD-10-CM | POA: Diagnosis not present

## 2018-08-10 DIAGNOSIS — J3089 Other allergic rhinitis: Secondary | ICD-10-CM | POA: Diagnosis not present

## 2018-08-28 DIAGNOSIS — J3089 Other allergic rhinitis: Secondary | ICD-10-CM | POA: Diagnosis not present

## 2018-08-28 DIAGNOSIS — J301 Allergic rhinitis due to pollen: Secondary | ICD-10-CM | POA: Diagnosis not present

## 2018-08-31 DIAGNOSIS — J301 Allergic rhinitis due to pollen: Secondary | ICD-10-CM | POA: Diagnosis not present

## 2018-08-31 DIAGNOSIS — J3089 Other allergic rhinitis: Secondary | ICD-10-CM | POA: Diagnosis not present

## 2018-09-21 DIAGNOSIS — J301 Allergic rhinitis due to pollen: Secondary | ICD-10-CM | POA: Diagnosis not present

## 2018-09-21 DIAGNOSIS — J3089 Other allergic rhinitis: Secondary | ICD-10-CM | POA: Diagnosis not present

## 2018-10-01 DIAGNOSIS — J3089 Other allergic rhinitis: Secondary | ICD-10-CM | POA: Diagnosis not present

## 2018-10-01 DIAGNOSIS — J301 Allergic rhinitis due to pollen: Secondary | ICD-10-CM | POA: Diagnosis not present

## 2018-10-14 DIAGNOSIS — J029 Acute pharyngitis, unspecified: Secondary | ICD-10-CM | POA: Diagnosis not present

## 2018-10-14 DIAGNOSIS — R509 Fever, unspecified: Secondary | ICD-10-CM | POA: Diagnosis not present

## 2018-10-14 DIAGNOSIS — M791 Myalgia, unspecified site: Secondary | ICD-10-CM | POA: Diagnosis not present

## 2020-07-11 ENCOUNTER — Encounter (HOSPITAL_BASED_OUTPATIENT_CLINIC_OR_DEPARTMENT_OTHER): Payer: Self-pay | Admitting: *Deleted

## 2020-07-11 ENCOUNTER — Emergency Department (HOSPITAL_BASED_OUTPATIENT_CLINIC_OR_DEPARTMENT_OTHER): Payer: BC Managed Care – PPO

## 2020-07-11 ENCOUNTER — Emergency Department (HOSPITAL_BASED_OUTPATIENT_CLINIC_OR_DEPARTMENT_OTHER)
Admission: EM | Admit: 2020-07-11 | Discharge: 2020-07-11 | Disposition: A | Discharge status: Home / Self Care | Payer: BC Managed Care – PPO | Attending: Emergency Medicine | Admitting: Emergency Medicine

## 2020-07-11 ENCOUNTER — Other Ambulatory Visit: Payer: Self-pay

## 2020-07-11 DIAGNOSIS — R0602 Shortness of breath: Secondary | ICD-10-CM | POA: Insufficient documentation

## 2020-07-11 DIAGNOSIS — R0789 Other chest pain: Secondary | ICD-10-CM | POA: Diagnosis not present

## 2020-07-11 DIAGNOSIS — R200 Anesthesia of skin: Secondary | ICD-10-CM | POA: Diagnosis not present

## 2020-07-11 DIAGNOSIS — R079 Chest pain, unspecified: Secondary | ICD-10-CM | POA: Diagnosis not present

## 2020-07-11 DIAGNOSIS — R202 Paresthesia of skin: Secondary | ICD-10-CM | POA: Diagnosis not present

## 2020-07-11 DIAGNOSIS — S76212A Strain of adductor muscle, fascia and tendon of left thigh, initial encounter: Secondary | ICD-10-CM | POA: Diagnosis not present

## 2020-07-11 LAB — HEPATIC FUNCTION PANEL
ALT: 28 U/L (ref 0–44)
AST: 27 U/L (ref 15–41)
Albumin: 4.5 g/dL (ref 3.5–5.0)
Alkaline Phosphatase: 70 U/L (ref 38–126)
Bilirubin, Direct: 0.1 mg/dL (ref 0.0–0.2)
Indirect Bilirubin: 0.8 mg/dL (ref 0.3–0.9)
Total Bilirubin: 0.9 mg/dL (ref 0.3–1.2)
Total Protein: 7.5 g/dL (ref 6.5–8.1)

## 2020-07-11 LAB — CBC WITH DIFFERENTIAL/PLATELET
Abs Immature Granulocytes: 0.04 10*3/uL (ref 0.00–0.07)
Basophils Absolute: 0 10*3/uL (ref 0.0–0.1)
Basophils Relative: 0 %
Eosinophils Absolute: 0.1 10*3/uL (ref 0.0–0.5)
Eosinophils Relative: 1 %
HCT: 45.7 % (ref 39.0–52.0)
Hemoglobin: 16.6 g/dL (ref 13.0–17.0)
Immature Granulocytes: 1 %
Lymphocytes Relative: 38 %
Lymphs Abs: 2.9 10*3/uL (ref 0.7–4.0)
MCH: 31.1 pg (ref 26.0–34.0)
MCHC: 36.3 g/dL — ABNORMAL HIGH (ref 30.0–36.0)
MCV: 85.6 fL (ref 80.0–100.0)
Monocytes Absolute: 0.6 10*3/uL (ref 0.1–1.0)
Monocytes Relative: 8 %
Neutro Abs: 4.1 10*3/uL (ref 1.7–7.7)
Neutrophils Relative %: 52 %
Platelets: 234 10*3/uL (ref 150–400)
RBC: 5.34 MIL/uL (ref 4.22–5.81)
RDW: 11.5 % (ref 11.5–15.5)
WBC: 7.8 10*3/uL (ref 4.0–10.5)
nRBC: 0 % (ref 0.0–0.2)

## 2020-07-11 LAB — BASIC METABOLIC PANEL
Anion gap: 11 (ref 5–15)
BUN: 13 mg/dL (ref 6–20)
CO2: 24 mmol/L (ref 22–32)
Calcium: 9.2 mg/dL (ref 8.9–10.3)
Chloride: 103 mmol/L (ref 98–111)
Creatinine, Ser: 0.89 mg/dL (ref 0.61–1.24)
GFR, Estimated: >60 mL/min (ref >60)
Glucose, Bld: 129 mg/dL — ABNORMAL HIGH (ref 70–99)
Potassium: 3.8 mmol/L (ref 3.5–5.1)
Sodium: 138 mmol/L (ref 135–145)

## 2020-07-11 LAB — D-DIMER, QUANTITATIVE (NOT AT ARMC): D-Dimer, Quant: 0.34 ug/mL-FEU (ref 0.00–0.50)

## 2020-07-11 LAB — LIPASE, BLOOD: Lipase: 27 U/L (ref 11–51)

## 2020-07-11 LAB — TROPONIN I (HIGH SENSITIVITY): Troponin I (High Sensitivity): 3 ng/L (ref <18)

## 2020-07-11 NOTE — ED Triage Notes (Signed)
Left leg pain and left arm pain. Icy hot feeling in his left chest today. He was seen at [facility] and told to come here for further evaluation. 2 weeks ago he was driving and after stopping at a rest stop he felt like he could not get his breath. Symptom resolved without intervention. He is very talkative.

## 2020-07-11 NOTE — ED Provider Notes (Signed)
[facility] EMERGENCY DEPARTMENT Provider Note   CSN: [id number] Arrival date & time: 07/11/20  1820     History Chief Complaint  Patient presents with  . Chest Pain    Frank Jensen is a 39 y.o. male.  The history is provided by the patient.  Chest Pain Pain location:  L chest Pain quality: aching   Pain severity:  Mild Progression:  Resolved Context: at rest   Relieved by:  Nothing Worsened by:  Nothing Associated symptoms: numbness and shortness of breath   Associated symptoms: no abdominal pain, no back pain, no cough, no fever, no palpitations and no vomiting   Associated symptoms comment:  Parasthesia to arms/legs/head, near syncopal type event two weeks ago. Asymptomatic now. No stress or new anxiety, long travel recently  Risk factors: no high cholesterol, no hypertension and no prior DVT/PE        History reviewed. No pertinent past medical history.  Patient Active Problem List   Diagnosis Date Noted  . Post concussion syndrome 08/02/2013    Past Surgical History:  Procedure Laterality Date  . SHOULDER SURGERY Left 2001, 2012   Torn labrum       Family History  Problem Relation Age of Onset  . Pancreatic cancer Mother        Living, 61  . Healthy Father        Living, 78  . Asperger's syndrome Brother   . Hirschsprung's disease Brother   . Healthy Son     Social History   Tobacco Use  . Smoking status: Never Smoker  . Smokeless tobacco: Never Used  Substance Use Topics  . Alcohol use: No    Alcohol/week: 0.0 standard drinks  . Drug use: No    Home Medications Prior to Admission medications   Medication Sig Start Date End Date Taking? Authorizing Provider  amitriptyline (ELAVIL) 10 MG tablet Take 1 tablet (10 mg total) by mouth at bedtime. 07/20/15   Nita Sickle K, DO  cetirizine (ZYRTEC) 10 MG tablet Take 10 mg by mouth daily.      [provider]  diazepam (VALIUM) 5 MG tablet TK BOTH TABLETS PO PRIOR TO  PROCEDURE 05/30/15   [provider]  FLULAVAL QUADRIVALENT injection ADM 0.5ML IM UTD 07/08/15   [provider]  orphenadrine (NORFLEX) 100 MG tablet Take 100 mg by mouth 2 (two) times daily.    [provider]    Allergies    Amitriptyline, Aspirin, and Nortriptyline  Review of Systems   Review of Systems  Constitutional: Negative for chills and fever.  HENT: Negative for ear pain and sore throat.   Eyes: Negative for pain and visual disturbance.  Respiratory: Positive for shortness of breath. Negative for cough.   Cardiovascular: Positive for chest pain. Negative for palpitations.  Gastrointestinal: Negative for abdominal pain and vomiting.  Genitourinary: Negative for dysuria and hematuria.  Musculoskeletal: Negative for arthralgias and back pain.  Skin: Negative for color change and rash.  Neurological: Positive for numbness. Negative for seizures and syncope.  All other systems reviewed and are negative.   Physical Exam Updated Vital Signs BP 115/76 (BP Location: Right Arm)   Pulse 80   Temp 98.2 F (36.8 C) (Oral)   Resp 16   Ht 5\' 8"  (1.727 m)   Wt 82.6 kg   SpO2 97%   BMI 27.67 kg/m   Physical Exam Vitals and nursing note reviewed.  Constitutional:      General: He is  not in acute distress.    Appearance: He is well-developed. He is not ill-appearing.  HENT:     Head: Normocephalic and atraumatic.  Eyes:     Extraocular Movements: Extraocular movements intact.     Conjunctiva/sclera: Conjunctivae normal.     Pupils: Pupils are equal, round, and reactive to light.  Cardiovascular:     Rate and Rhythm: Normal rate and regular rhythm.     Pulses:          Radial pulses are 2+ on the right side and 2+ on the left side.     Heart sounds: Normal heart sounds. No murmur heard.   Pulmonary:     Effort: Pulmonary effort is normal. No respiratory distress.     Breath sounds: Normal breath sounds. No decreased breath sounds, wheezing or  rhonchi.  Abdominal:     Palpations: Abdomen is soft.     Tenderness: There is no abdominal tenderness.  Musculoskeletal:        General: Normal range of motion.     Cervical back: Normal range of motion and neck supple.  Skin:    General: Skin is warm and dry.     Capillary Refill: Capillary refill takes less than 2 seconds.  Neurological:     General: No focal deficit present.     Mental Status: He is alert and oriented to person, place, and time.     Cranial Nerves: No cranial nerve deficit.     Motor: No weakness.     Comments: Normal sensation   Psychiatric:        Mood and Affect: Mood normal.     ED Results / Procedures / Treatments   Labs (all labs ordered are listed, but only abnormal results are displayed) Labs Reviewed  CBC WITH DIFFERENTIAL/PLATELET - Abnormal; Notable for the following components:      Result Value   MCHC 36.3 (*)    All other components within normal limits  BASIC METABOLIC PANEL - Abnormal; Notable for the following components:   Glucose, Bld 129 (*)    All other components within normal limits  HEPATIC FUNCTION PANEL  LIPASE, BLOOD  D-DIMER, QUANTITATIVE (NOT AT [facility])  TROPONIN I (HIGH SENSITIVITY)    EKG EKG Interpretation  Date/Time:  Tuesday July 11 2020 18:24:12 EST Ventricular Rate:  78 PR Interval:  154 QRS Duration: 106 QT Interval:  356 QTC Calculation: 405 R Axis:   65 Text Interpretation: Normal sinus rhythm Right atrial enlargement Borderline ECG Confirmed by Virgina Norfolk [phone]) on 07/11/2020 6:44:21 PM   Radiology DG Chest Portable 1 View  Result Date: 07/11/2020 CLINICAL DATA:  Mid to left-sided chest pain for 6 hours, left upper extremity numbness EXAM: PORTABLE CHEST 1 VIEW COMPARISON:  06/30/2017 FINDINGS: The heart size and mediastinal contours are within normal limits. Both lungs are clear. The visualized skeletal structures are unremarkable. IMPRESSION: No active disease. Electronically Signed   By:  Sharlet Salina M.D.   On: 07/11/2020 19:37    Procedures Procedures (including critical care time)  Medications Ordered in ED Medications - No data to display  ED Course  I have reviewed the triage vital signs and the nursing notes.  Pertinent labs & imaging results that were available during my care of the patient were reviewed by me and considered in my medical decision making (see chart for details).    MDM Rules/Calculators/A&P  Rondell Pardon is here for chest pain.  Normal vitals.  No fever.  Atypical chest pain story.  Possibly near syncopal episode 2 weeks ago.  Having some paresthesias to the left arm and leg.  Neurovascularly neuromuscularly intact.  No leg swelling.  Normal neurological exam.  EKG shows sinus rhythm.  No ischemic changes.  Troponin normal.  D-dimer normal.  Doubt ACS or PE.  Chest x-ray without any signs of infection.  No pneumothorax.  Overall suspect may be palpitations or anxiety.  Recommend Holter monitor with primary care doctor.  Given return precautions and discharged from ED in good condition.  This chart was dictated using voice recognition software.  Despite best efforts to proofread,  errors can occur which can change the documentation meaning.    Final Clinical Impression(s) / ED Diagnoses Final diagnoses:  Atypical chest pain    Rx / DC Orders ED Discharge Orders    None       Virgina Norfolk, DO 07/11/20 2019

## 2020-10-27 DIAGNOSIS — J301 Allergic rhinitis due to pollen: Secondary | ICD-10-CM | POA: Diagnosis not present

## 2020-10-28 DIAGNOSIS — J3089 Other allergic rhinitis: Secondary | ICD-10-CM | POA: Diagnosis not present

## 2020-12-25 DIAGNOSIS — J301 Allergic rhinitis due to pollen: Secondary | ICD-10-CM | POA: Diagnosis not present

## 2020-12-25 DIAGNOSIS — J3089 Other allergic rhinitis: Secondary | ICD-10-CM | POA: Diagnosis not present

## 2021-01-02 DIAGNOSIS — J3089 Other allergic rhinitis: Secondary | ICD-10-CM | POA: Diagnosis not present

## 2021-01-02 DIAGNOSIS — J301 Allergic rhinitis due to pollen: Secondary | ICD-10-CM | POA: Diagnosis not present

## 2021-01-05 DIAGNOSIS — J3089 Other allergic rhinitis: Secondary | ICD-10-CM | POA: Diagnosis not present

## 2021-01-05 DIAGNOSIS — J301 Allergic rhinitis due to pollen: Secondary | ICD-10-CM | POA: Diagnosis not present

## 2021-01-11 DIAGNOSIS — J301 Allergic rhinitis due to pollen: Secondary | ICD-10-CM | POA: Diagnosis not present

## 2021-01-11 DIAGNOSIS — J3089 Other allergic rhinitis: Secondary | ICD-10-CM | POA: Diagnosis not present

## 2021-01-22 DIAGNOSIS — J301 Allergic rhinitis due to pollen: Secondary | ICD-10-CM | POA: Diagnosis not present

## 2021-01-22 DIAGNOSIS — J3089 Other allergic rhinitis: Secondary | ICD-10-CM | POA: Diagnosis not present

## 2021-02-09 DIAGNOSIS — J301 Allergic rhinitis due to pollen: Secondary | ICD-10-CM | POA: Diagnosis not present

## 2021-02-09 DIAGNOSIS — J3089 Other allergic rhinitis: Secondary | ICD-10-CM | POA: Diagnosis not present

## 2021-02-26 DIAGNOSIS — J3089 Other allergic rhinitis: Secondary | ICD-10-CM | POA: Diagnosis not present

## 2021-02-26 DIAGNOSIS — J301 Allergic rhinitis due to pollen: Secondary | ICD-10-CM | POA: Diagnosis not present

## 2021-03-05 DIAGNOSIS — J3089 Other allergic rhinitis: Secondary | ICD-10-CM | POA: Diagnosis not present

## 2021-03-05 DIAGNOSIS — J301 Allergic rhinitis due to pollen: Secondary | ICD-10-CM | POA: Diagnosis not present

## 2021-03-21 DIAGNOSIS — J3089 Other allergic rhinitis: Secondary | ICD-10-CM | POA: Diagnosis not present

## 2021-03-21 DIAGNOSIS — J301 Allergic rhinitis due to pollen: Secondary | ICD-10-CM | POA: Diagnosis not present

## 2021-03-26 DIAGNOSIS — J3089 Other allergic rhinitis: Secondary | ICD-10-CM | POA: Diagnosis not present

## 2021-03-26 DIAGNOSIS — J301 Allergic rhinitis due to pollen: Secondary | ICD-10-CM | POA: Diagnosis not present

## 2021-04-05 DIAGNOSIS — J301 Allergic rhinitis due to pollen: Secondary | ICD-10-CM | POA: Diagnosis not present

## 2021-04-05 DIAGNOSIS — J3089 Other allergic rhinitis: Secondary | ICD-10-CM | POA: Diagnosis not present

## 2021-04-11 DIAGNOSIS — J3089 Other allergic rhinitis: Secondary | ICD-10-CM | POA: Diagnosis not present

## 2021-04-11 DIAGNOSIS — J301 Allergic rhinitis due to pollen: Secondary | ICD-10-CM | POA: Diagnosis not present

## 2021-04-13 DIAGNOSIS — J3089 Other allergic rhinitis: Secondary | ICD-10-CM | POA: Diagnosis not present

## 2021-04-13 DIAGNOSIS — J301 Allergic rhinitis due to pollen: Secondary | ICD-10-CM | POA: Diagnosis not present

## 2021-04-24 DIAGNOSIS — J3089 Other allergic rhinitis: Secondary | ICD-10-CM | POA: Diagnosis not present

## 2021-04-24 DIAGNOSIS — J301 Allergic rhinitis due to pollen: Secondary | ICD-10-CM | POA: Diagnosis not present

## 2021-07-13 DIAGNOSIS — R062 Wheezing: Secondary | ICD-10-CM | POA: Diagnosis not present

## 2021-07-13 DIAGNOSIS — J40 Bronchitis, not specified as acute or chronic: Secondary | ICD-10-CM | POA: Diagnosis not present

## 2021-07-13 DIAGNOSIS — R059 Cough, unspecified: Secondary | ICD-10-CM | POA: Diagnosis not present

## 2023-12-29 ENCOUNTER — Other Ambulatory Visit: Payer: Self-pay | Admitting: Family Medicine

## 2023-12-29 DIAGNOSIS — E7849 Other hyperlipidemia: Secondary | ICD-10-CM

## 2024-08-19 ENCOUNTER — Ambulatory Visit: Admitting: Podiatry

## 2024-08-19 ENCOUNTER — Ambulatory Visit

## 2024-08-19 VITALS — Ht 68.0 in | Wt 195.0 lb

## 2024-08-19 DIAGNOSIS — M79672 Pain in left foot: Secondary | ICD-10-CM | POA: Diagnosis not present

## 2024-08-19 DIAGNOSIS — M7752 Other enthesopathy of left foot: Secondary | ICD-10-CM

## 2024-08-19 NOTE — Patient Instructions (Signed)
" °  VISIT SUMMARY: Today, you were seen for chronic left foot pain due to recurrent intermetatarsal bursitis. This condition has been troubling you for many years, especially after wearing tight footwear like ski boots. We discussed your symptoms, examined your foot, and reviewed your treatment options.  YOUR PLAN: -INTERMETATARSAL BURSITIS, LEFT FOOT: Intermetatarsal bursitis is inflammation of the bursa, a fluid-filled sac between the bones of your foot, often caused by pressure from tight shoes. Today, you received a corticosteroid injection to help reduce inflammation and pain. You should optimize the fit of your ski boots to reduce pressure on your foot. If symptoms persist, consider using a custom sport orthotic insole for your ski boots. You can use NSAIDs like ibuprofen or naproxen and ice packs for pain relief. Additionally, you may try Voltaren gel up to four times daily, although it may not penetrate deeply. Follow up for orthotic fitting if needed, and surgery is only considered for severe cases.  INSTRUCTIONS: Please follow up for orthotic fitting if your symptoms persist after optimizing your ski boot fit. Continue using NSAIDs and ice packs as needed for pain relief, and try Voltaren gel up to four times daily if necessary.                      Contains text generated by Abridge.                                 Contains text generated by Abridge.   "

## 2024-08-19 NOTE — Progress Notes (Signed)
 "  Subjective:  Patient ID: Frank Jensen, male    DOB: Mar 04, 1981,  MRN: [id number]  Chief Complaint  Patient presents with   Foot Pain    RM 21  Likely/potential neuroma or other nerve pain/numbness in between toes mid ball of foot, especially when in tight shoes (ski boots etc). Potential cortisone shot and/or other solution? Pt states pain has been ongoing for the past 15 years.    Discussed the use of AI scribe software for clinical note transcription with the patient, who gave verbal consent to proceed.  History of Present Illness Frank Jensen is a 44 year old male with chronic left foot pain who presents for evaluation of recurrent intermetatarsal bursitis.  Chronic left foot pain has persisted for 12 to 18 years, initially following use of a tightly fitted ski boot. Pain is described as sharp, neuropathic, and at times excruciating, with intermittent numbness. The most reproducible pain is localized to the area between the toes and is aggravated by tight footwear, including ski boots. He routinely wears wide or oversized shoes to minimize symptoms.  A recent exacerbation occurred after a ski vacation, during which he experienced severe, sharp pain and near numbness in the left foot. Pain is typically focal but can feel more widespread with increased or prolonged pressure. Mild tenderness is present in adjacent regions, but the most severe pain remains localized.  He was previously diagnosed with Morton's neuroma and received a corticosteroid injection 4 to 6 years ago, which provided relief at that time. He intermittently uses NSAIDs, including ibuprofen and naproxen, for pain control and has inquired about topical NSAIDs such as diclofenac gel. No other significant foot pain is present outside the described area.      Objective:    Physical Exam VASCULAR: DP and PT pulse palpable. Foot is warm and well-perfused. Capillary fill time is brisk. DERMATOLOGIC: Normal skin turgor, texture,  and temperature. No open lesions, rashes, or ulcerations. NEUROLOGIC: Normal sensation to light touch and pressure. No paresthesias. ORTHOPEDIC: Sharp pain on palpation in proximal third intermetatarsal space with dorsal lateral, dorsal plantar compression, slight tenderness with medial lateral compression. Bone structure normal, no evidence of stress fractures. Smooth pain-free range of motion of all examined joints. No ecchymosis or bruising. No gross deformity.   No images are attached to the encounter.    Results Radiology Left foot radiographs (08/19/2024): Normal bone structure and alignment, normal bone mineralization, no fracture or stress fracture (Independently interpreted)  Corticosteroid injection, left fourth intermetatarsal space Skin prepped with alcohol. Ethyl chloride spray applied as topical anesthetic. 10 mg Kenalog, 4 mg dexamethasone , and 5 mg Marcaine injected into the fourth intermetatarsal space via dorsal approach. No immediate complications. Tolerated well.   Assessment:   1. Bursitis of intermetatarsal bursa of left foot      Plan:  Patient was evaluated and treated and all questions answered.  Assessment and Plan Assessment & Plan Intermetatarsal bursitis, left foot Chronic, recurrent intermetatarsal bursitis of the left foot, likely exacerbated by tight footwear such as ski boots. Examination and radiographs showed no evidence of stress fracture or neuroma. Symptoms include sharp, localized pain aggravated by pressure and constrictive shoes. Surgical excision is reserved for severe, refractory cases and is not indicated currently. - Administered corticosteroid injection (10 mg Kenalog, 4 mg dexamethasone , 5 mg Marcaine) into the fourth metatarsal space via dorsal approach after obtaining consent and prepping skin. - Provided post-injection care instructions. - Recommended optimizing ski boot fit and consider custom fit boots  to minimize pressure and symptom  aggravation. - Discussed custom sport orthotic insole for ski boots if symptoms persist after boot fitting. - Advised use of NSAIDs (ibuprofen, naproxen) and ice packs for symptom flares. - Discussed topical NSAID (Voltaren gel) up to four times daily, noting limited penetration to deeper structures but no harm in trial. - Advised follow-up for orthotic fitting if symptoms persist after boot fitting. - Surgical excision discussed as a rare option for severe, refractory cases, but not indicated at this time.      Return if symptoms worsen or fail to improve.   "

## 2024-08-25 ENCOUNTER — Inpatient Hospital Stay: Admission: RE | Admit: 2024-08-25 | Discharge: 2024-08-25 | Attending: Family Medicine

## 2024-08-25 DIAGNOSIS — E7849 Other hyperlipidemia: Secondary | ICD-10-CM

## 2024-08-31 ENCOUNTER — Telehealth: Payer: Self-pay | Admitting: Lab

## 2024-08-31 DIAGNOSIS — M7741 Metatarsalgia, right foot: Secondary | ICD-10-CM

## 2024-08-31 DIAGNOSIS — M7752 Other enthesopathy of left foot: Secondary | ICD-10-CM

## 2024-08-31 NOTE — Telephone Encounter (Signed)
 Patient wife left message requesting a ski boot prescription wants a call back.

## 2024-08-31 NOTE — Addendum Note (Signed)
 Addended byBETHA MEDICINE, Tanyla Stege R on: 08/31/2024 02:32 PM   Modules accepted: Orders

## 2024-08-31 NOTE — Telephone Encounter (Signed)
 Wife states he is need of custom ski boots due to pain and foot issues is this something you can provide patient needing prescription for this.
